# Patient Record
Sex: Female | Born: 1995 | Race: Black or African American | Hispanic: No | Marital: Single | Smoking: Current every day smoker
Health system: Southern US, Community
[De-identification: ages and names within clinical notes are randomized; demographics above are authoritative.]

---

## 2015-04-15 ENCOUNTER — Emergency Department (HOSPITAL_COMMUNITY)
Admission: EM | Admit: 2015-04-15 | Discharge: 2015-04-15 | Disposition: A | Discharge status: Home / Self Care | Payer: BLUE CROSS/BLUE SHIELD | Attending: Emergency Medicine | Admitting: Emergency Medicine

## 2015-04-15 ENCOUNTER — Emergency Department (HOSPITAL_COMMUNITY): Payer: BLUE CROSS/BLUE SHIELD

## 2015-04-15 ENCOUNTER — Encounter (HOSPITAL_COMMUNITY): Payer: Self-pay | Admitting: *Deleted

## 2015-04-15 DIAGNOSIS — R1110 Vomiting, unspecified: Secondary | ICD-10-CM | POA: Insufficient documentation

## 2015-04-15 DIAGNOSIS — J069 Acute upper respiratory infection, unspecified: Secondary | ICD-10-CM | POA: Diagnosis not present

## 2015-04-15 DIAGNOSIS — Z720 Tobacco use: Secondary | ICD-10-CM | POA: Diagnosis not present

## 2015-04-15 DIAGNOSIS — J988 Other specified respiratory disorders: Secondary | ICD-10-CM

## 2015-04-15 DIAGNOSIS — R079 Chest pain, unspecified: Secondary | ICD-10-CM | POA: Insufficient documentation

## 2015-04-15 DIAGNOSIS — B9789 Other viral agents as the cause of diseases classified elsewhere: Secondary | ICD-10-CM

## 2015-04-15 DIAGNOSIS — R05 Cough: Secondary | ICD-10-CM | POA: Diagnosis present

## 2015-04-15 LAB — CBC
HCT: 34.8 % — ABNORMAL LOW (ref 36.0–46.0)
Hemoglobin: 11.1 g/dL — ABNORMAL LOW (ref 12.0–15.0)
MCH: 25.8 pg — ABNORMAL LOW (ref 26.0–34.0)
MCHC: 31.9 g/dL (ref 30.0–36.0)
MCV: 80.9 fL (ref 78.0–100.0)
PLATELETS: 343 10*3/uL (ref 150–400)
RBC: 4.3 MIL/uL (ref 3.87–5.11)
RDW: 12.8 % (ref 11.5–15.5)
WBC: 10.7 10*3/uL — AB (ref 4.0–10.5)

## 2015-04-15 LAB — COMPREHENSIVE METABOLIC PANEL
ALBUMIN: 3.2 g/dL — AB (ref 3.5–5.2)
ALK PHOS: 62 U/L (ref 39–117)
ALT: 28 U/L (ref 0–35)
AST: 26 U/L (ref 0–37)
Anion gap: 11 (ref 5–15)
BILIRUBIN TOTAL: 0.2 mg/dL — AB (ref 0.3–1.2)
BUN: 9 mg/dL (ref 6–23)
CHLORIDE: 104 mmol/L (ref 96–112)
CO2: 21 mmol/L (ref 19–32)
Calcium: 8.7 mg/dL (ref 8.4–10.5)
Creatinine, Ser: 0.76 mg/dL (ref 0.50–1.10)
GFR calc Af Amer: >90 mL/min (ref >90)
GFR calc non Af Amer: >90 mL/min (ref >90)
Glucose, Bld: 101 mg/dL — ABNORMAL HIGH (ref 70–99)
POTASSIUM: 3.8 mmol/L (ref 3.5–5.1)
Sodium: 136 mmol/L (ref 135–145)
Total Protein: 7 g/dL (ref 6.0–8.3)

## 2015-04-15 LAB — I-STAT CG4 LACTIC ACID, ED: Lactic Acid, Venous: 0.55 mmol/L (ref 0.5–2.0)

## 2015-04-15 MED ORDER — HYDROCODONE-HOMATROPINE 5-1.5 MG/5ML PO SYRP: 5.0000 mL | ORAL_SOLUTION | Freq: Four times a day (QID) | ORAL | Status: DC | PRN

## 2015-04-15 MED ORDER — ALBUTEROL SULFATE HFA 108 (90 BASE) MCG/ACT IN AERS
2.0000 | INHALATION_SPRAY | Freq: Once | RESPIRATORY_TRACT | Status: AC
  Administered 2015-04-15: 2 via RESPIRATORY_TRACT
  Filled 2015-04-15: qty 6.7

## 2015-04-15 MED ORDER — HYDROCODONE-HOMATROPINE 5-1.5 MG/5ML PO SYRP
5.0000 mL | ORAL_SOLUTION | Freq: Once | ORAL | Status: AC
  Administered 2015-04-15: 5 mL via ORAL
  Filled 2015-04-15: qty 5

## 2015-04-15 NOTE — Discharge Instructions (Signed)
Please follow up with your primary care physician in 1-2 days. If you do not have one please call the [facility] number listed above. Please take pain medication and/or muscle relaxants as prescribed and as needed for pain. Please do not drive on narcotic pain medication or on muscle relaxants. Please use your inhaler 2 puffs every four hours for the next few days. Please read all discharge instructions and return precautions.   Upper Respiratory Infection, Adult An upper respiratory infection (URI) is also sometimes known as the common cold. The upper respiratory tract includes the nose, sinuses, throat, trachea, and bronchi. Bronchi are the airways leading to the lungs. Most people improve within 1 week, but symptoms can last up to 2 weeks. A residual cough may last even longer.  CAUSES Many different viruses can infect the tissues lining the upper respiratory tract. The tissues become irritated and inflamed and often become very moist. Mucus production is also common. A cold is contagious. You can easily spread the virus to others by oral contact. This includes kissing, sharing a glass, coughing, or sneezing. Touching your mouth or nose and then touching a surface, which is then touched by another person, can also spread the virus. SYMPTOMS  Symptoms typically develop 1 to 3 days after you come in contact with a cold virus. Symptoms vary from person to person. They may include:  Runny nose.  Sneezing.  Nasal congestion.  Sinus irritation.  Sore throat.  Loss of voice (laryngitis).  Cough.  Fatigue.  Muscle aches.  Loss of appetite.  Headache.  Low-grade fever. DIAGNOSIS  You might diagnose your own cold based on familiar symptoms, since most people get a cold 2 to 3 times a year. Your caregiver can confirm this based on your exam. Most importantly, your caregiver can check that your symptoms are not due to another disease such as strep throat, sinusitis,  pneumonia, asthma, or epiglottitis. Blood tests, throat tests, and X-rays are not necessary to diagnose a common cold, but they may sometimes be helpful in excluding other more serious diseases. Your caregiver will decide if any further tests are required. RISKS AND COMPLICATIONS  You may be at risk for a more severe case of the common cold if you smoke cigarettes, have chronic heart disease (such as heart failure) or lung disease (such as asthma), or if you have a weakened immune system. The very young and very old are also at risk for more serious infections. Bacterial sinusitis, middle ear infections, and bacterial pneumonia can complicate the common cold. The common cold can worsen asthma and chronic obstructive pulmonary disease (COPD). Sometimes, these complications can require emergency medical care and may be life-threatening. PREVENTION  The best way to protect against getting a cold is to practice good hygiene. Avoid oral or hand contact with people with cold symptoms. Wash your hands often if contact occurs. There is no clear evidence that vitamin C, vitamin E, echinacea, or exercise reduces the chance of developing a cold. However, it is always recommended to get plenty of rest and practice good nutrition. TREATMENT  Treatment is directed at relieving symptoms. There is no cure. Antibiotics are not effective, because the infection is caused by a virus, not by bacteria. Treatment may include:  Increased fluid intake. Sports drinks offer valuable electrolytes, sugars, and fluids.  Breathing heated mist or steam (vaporizer or shower).  Eating chicken soup or other clear broths, and maintaining good nutrition.  Getting plenty of rest.  Using  gargles or lozenges for comfort.  Controlling fevers with ibuprofen or acetaminophen as directed by your caregiver.  Increasing usage of your inhaler if you have asthma. Zinc gel and zinc lozenges, taken in the first 24 hours of the common cold, can  shorten the duration and lessen the severity of symptoms. Pain medicines may help with fever, muscle aches, and throat pain. A variety of non-prescription medicines are available to treat congestion and runny nose. Your caregiver can make recommendations and may suggest nasal or lung inhalers for other symptoms.  HOME CARE INSTRUCTIONS   Only take over-the-counter or prescription medicines for pain, discomfort, or fever as directed by your caregiver.  Use a warm mist humidifier or inhale steam from a shower to increase air moisture. This may keep secretions moist and make it easier to breathe.  Drink enough water and fluids to keep your urine clear or pale yellow.  Rest as needed.  Return to work when your temperature has returned to normal or as your caregiver advises. You may need to stay home longer to avoid infecting others. You can also use a face mask and careful hand washing to prevent spread of the virus. SEEK MEDICAL CARE IF:   After the first few days, you feel you are getting worse rather than better.  You need your caregiver's advice about medicines to control symptoms.  You develop chills, worsening shortness of breath, or brown or red sputum. These may be signs of pneumonia.  You develop yellow or brown nasal discharge or pain in the face, especially when you bend forward. These may be signs of sinusitis.  You develop a fever, swollen neck glands, pain with swallowing, or white areas in the back of your throat. These may be signs of strep throat. SEEK IMMEDIATE MEDICAL CARE IF:   You have a fever.  You develop severe or persistent headache, ear pain, sinus pain, or chest pain.  You develop wheezing, a prolonged cough, cough up blood, or have a change in your usual mucus (if you have chronic lung disease).  You develop sore muscles or a stiff neck. Document Released: 06/05/2001 Document Revised: 03/03/2012 Document Reviewed: 03/17/2014 [facility] Patient Information 2015  [place]. This information is not intended to replace advice given to you by your health care provider. Make sure you discuss any questions you have with your health care provider.

## 2015-04-15 NOTE — ED Provider Notes (Signed)
CSN: [id number]     Arrival date & time 04/15/15  0229 History   First MD Initiated Contact with Patient 04/15/15 [phone]     Chief Complaint  Patient presents with  . Cough     (Consider location/radiation/quality/duration/timing/severity/associated sxs/prior Treatment) HPI Comments: Patient is a 19 year old female past medical history significant for tobacco abuse presenting to the emergency department for evaluation of 2 weeks of nonproductive cough, nasal congestion, post tussive emesis, chest wall pain, subjective fever and chills. She states she was seen in urgent care was prescribed a cough suppressant that has not been providing any support. She states she's been taking her inhaler twice a day with little to no improvement. No modifying factors identified. She states she has recurrent history of bronchitis. Patient notes possible sick contacts in dorm.   History reviewed. No pertinent past medical history. History reviewed. No pertinent past surgical history. History reviewed. No pertinent family history. History  Substance Use Topics  . Smoking status: Current Every Day Smoker  . Smokeless tobacco: Not on file  . Alcohol Use: Not on file   OB History    No data available     Review of Systems  Constitutional: Positive for fever (subjective).  HENT: Positive for congestion.   Respiratory: Positive for cough and chest tightness.   Gastrointestinal: Positive for vomiting (posttussive).  All other systems reviewed and are negative.     Allergies  Review of patient's allergies indicates no known allergies.  Home Medications   Prior to Admission medications   Medication Sig Start Date End Date Taking? Authorizing Provider  HYDROcodone-homatropine (HYCODAN) 5-1.5 MG/5ML syrup Take 5 mLs by mouth every 6 (six) hours as needed. 04/15/15   Bryler Dibble, PA-C   BP 101/62 mmHg  Pulse 97  Temp(Src) 98.6 F (37 C) (Oral)  Resp 18  Wt 190 lb (86.183 kg)  SpO2 99%   LMP 03/30/2015 Physical Exam  Constitutional: She is oriented to person, place, and time. She appears well-developed and well-nourished. No distress.  HENT:  Head: Normocephalic and atraumatic.  Right Ear: External ear normal.  Left Ear: External ear normal.  Nose: Nose normal.  Mouth/Throat: Oropharynx is clear and moist. No oropharyngeal exudate.  Eyes: Conjunctivae are normal.  Neck: Normal range of motion. Neck supple.  No nuchal rigidity.   Cardiovascular: Normal rate, regular rhythm and normal heart sounds.   Pulmonary/Chest: Effort normal and breath sounds normal. No respiratory distress. She exhibits tenderness.  Nonproductive cough on examination.  Abdominal: Soft.  Musculoskeletal: Normal range of motion. She exhibits no edema.  Lymphadenopathy:    She has no cervical adenopathy.  Neurological: She is alert and oriented to person, place, and time.  Skin: Skin is warm and dry. She is not diaphoretic.  Psychiatric: She has a normal mood and affect.  Nursing note and vitals reviewed.   ED Course  Procedures (including critical care time) Medications  HYDROcodone-homatropine (HYCODAN) 5-1.5 MG/5ML syrup 5 mL (5 mLs Oral Given 04/15/15 0504)  albuterol (PROVENTIL HFA;VENTOLIN HFA) 108 (90 BASE) MCG/ACT inhaler 2 puff (2 puffs Inhalation Given 04/15/15 0503)    Labs Review Labs Reviewed  COMPREHENSIVE METABOLIC PANEL - Abnormal; Notable for the following:    Glucose, Bld 101 (*)    Albumin 3.2 (*)    Total Bilirubin 0.2 (*)    All other components within normal limits  CBC - Abnormal; Notable for the following:    WBC 10.7 (*)    Hemoglobin 11.1 (*)  HCT 34.8 (*)    MCH 25.8 (*)    All other components within normal limits  I-STAT CG4 LACTIC ACID, ED    Imaging Review Dg Chest 2 View  04/15/2015   CLINICAL DATA:  Cough for 3 weeks.  Chest pain beginning today  EXAM: CHEST  2 VIEW  COMPARISON:  None.  FINDINGS: The heart size and mediastinal contours are within  normal limits. Both lungs are clear. The visualized skeletal structures are unremarkable.  IMPRESSION: No active cardiopulmonary disease.   Electronically Signed   By: Charlett NoseKevin  Dover M.D.   On: 04/15/2015 03:03     EKG Interpretation None      MDM   Final diagnoses:  Viral respiratory illness    Filed Vitals:   04/15/15 0430  BP: 101/62  Pulse: 97  Temp:   Resp: 18   Afebrile, NAD, non-toxic appearing, AAOx4.  Patients symptoms are consistent with URI, likely viral etiology. No hypoxia or fever to suggest pneumonia or PE. Lungs clear to auscultation bilaterally. No nuchal rigidity or toxicities to suggest meningitis. Discussed that antibiotics are not indicated for viral infections. Pt will be discharged with symptomatic treatment.  Patient verbalizes understanding and is agreeable with plan. Pt is hemodynamically stable at time of discharge.      Francee PiccoloJennifer Gracen Southwell, PA-C 04/15/15 [id number]  Mirian MoMatthew Gentry, MD 04/15/15 [phone]

## 2015-04-15 NOTE — ED Notes (Signed)
Pt in c/o cough and congestion for the last few weeks, intermittent vomiting, fever at times but denies over the last few days, no distress noted, coughing noted in triage

## 2016-01-16 ENCOUNTER — Emergency Department (HOSPITAL_COMMUNITY): Payer: BLUE CROSS/BLUE SHIELD

## 2016-01-16 ENCOUNTER — Emergency Department (HOSPITAL_COMMUNITY)
Admission: EM | Admit: 2016-01-16 | Discharge: 2016-01-16 | Disposition: A | Discharge status: Home / Self Care | Payer: BLUE CROSS/BLUE SHIELD | Attending: Emergency Medicine | Admitting: Emergency Medicine

## 2016-01-16 ENCOUNTER — Encounter (HOSPITAL_COMMUNITY): Payer: Self-pay | Admitting: *Deleted

## 2016-01-16 DIAGNOSIS — R0789 Other chest pain: Secondary | ICD-10-CM | POA: Diagnosis not present

## 2016-01-16 DIAGNOSIS — F17200 Nicotine dependence, unspecified, uncomplicated: Secondary | ICD-10-CM | POA: Insufficient documentation

## 2016-01-16 DIAGNOSIS — R0602 Shortness of breath: Secondary | ICD-10-CM | POA: Insufficient documentation

## 2016-01-16 DIAGNOSIS — Z792 Long term (current) use of antibiotics: Secondary | ICD-10-CM | POA: Diagnosis not present

## 2016-01-16 DIAGNOSIS — R079 Chest pain, unspecified: Secondary | ICD-10-CM | POA: Diagnosis present

## 2016-01-16 DIAGNOSIS — Z3202 Encounter for pregnancy test, result negative: Secondary | ICD-10-CM | POA: Insufficient documentation

## 2016-01-16 DIAGNOSIS — Z79899 Other long term (current) drug therapy: Secondary | ICD-10-CM | POA: Insufficient documentation

## 2016-01-16 LAB — CBC WITH DIFFERENTIAL/PLATELET
Basophils Absolute: 0 10*3/uL (ref 0.0–0.1)
Basophils Relative: 0 %
Eosinophils Absolute: 0.1 10*3/uL (ref 0.0–0.7)
Eosinophils Relative: 1 %
HEMATOCRIT: 36.3 % (ref 36.0–46.0)
HEMOGLOBIN: 11.9 g/dL — AB (ref 12.0–15.0)
LYMPHS PCT: 50 %
Lymphs Abs: 3.6 10*3/uL (ref 0.7–4.0)
MCH: 26.3 pg (ref 26.0–34.0)
MCHC: 32.8 g/dL (ref 30.0–36.0)
MCV: 80.3 fL (ref 78.0–100.0)
MONOS PCT: 9 %
Monocytes Absolute: 0.7 10*3/uL (ref 0.1–1.0)
NEUTROS ABS: 2.9 10*3/uL (ref 1.7–7.7)
NEUTROS PCT: 40 %
Platelets: 285 10*3/uL (ref 150–400)
RBC: 4.52 MIL/uL (ref 3.87–5.11)
RDW: 13.1 % (ref 11.5–15.5)
WBC: 7.3 10*3/uL (ref 4.0–10.5)

## 2016-01-16 LAB — I-STAT BETA HCG BLOOD, ED (MC, WL, AP ONLY): I-stat hCG, quantitative: <5 m[IU]/mL (ref <5)

## 2016-01-16 LAB — BASIC METABOLIC PANEL
ANION GAP: 9 (ref 5–15)
BUN: 10 mg/dL (ref 6–20)
CO2: 24 mmol/L (ref 22–32)
CREATININE: 0.91 mg/dL (ref 0.44–1.00)
Calcium: 9.1 mg/dL (ref 8.9–10.3)
Chloride: 104 mmol/L (ref 101–111)
GFR calc Af Amer: >60 mL/min (ref >60)
GFR calc non Af Amer: >60 mL/min (ref >60)
Glucose, Bld: 95 mg/dL (ref 65–99)
Potassium: 4.2 mmol/L (ref 3.5–5.1)
Sodium: 137 mmol/L (ref 135–145)

## 2016-01-16 LAB — D-DIMER, QUANTITATIVE: D-Dimer, Quant: 0.74 ug/mL-FEU — ABNORMAL HIGH (ref 0.00–0.50)

## 2016-01-16 MED ORDER — IOHEXOL 350 MG/ML SOLN
100.0000 mL | Freq: Once | INTRAVENOUS | Status: AC | PRN
  Administered 2016-01-16: 100 mL via INTRAVENOUS

## 2016-01-16 MED ORDER — KETOROLAC TROMETHAMINE 30 MG/ML IJ SOLN
30.0000 mg | Freq: Once | INTRAMUSCULAR | Status: AC
  Administered 2016-01-16: 30 mg via INTRAVENOUS
  Filled 2016-01-16: qty 1

## 2016-01-16 MED ORDER — OXYCODONE-ACETAMINOPHEN 5-325 MG PO TABS
1.0000 | ORAL_TABLET | Freq: Once | ORAL | Status: AC
  Administered 2016-01-16: 1 via ORAL
  Filled 2016-01-16: qty 1

## 2016-01-16 MED ORDER — MORPHINE SULFATE (PF) 4 MG/ML IV SOLN
4.0000 mg | Freq: Once | INTRAVENOUS | Status: AC
  Administered 2016-01-16: 4 mg via INTRAVENOUS
  Filled 2016-01-16: qty 1

## 2016-01-16 MED ORDER — HYDROCODONE-ACETAMINOPHEN 5-325 MG PO TABS: 1.0000 | ORAL_TABLET | ORAL | Status: AC | PRN

## 2016-01-16 NOTE — ED Provider Notes (Signed)
CSN: [id number]     Arrival date & time 01/16/16  0056 History   By signing my name below, I, Arlan Organ, attest that this documentation has been prepared under the direction and in the presence of Dione Booze, MD.  Electronically Signed: Arlan Organ, ED Scribe. 01/16/2016. 3:14 AM.   Chief Complaint  Patient presents with  . Chest Pain   The history is provided by the patient. No language interpreter was used.    HPI Comments: Stacey Pearson is a 20 y.o. female without any pertinent past medical history who presents to the Emergency Department complaining of constant, ongoing midsternal chest pain that radiates to sides bilaterally and back x 1 day; worsened in last few hours. Pain is exacerbated when sitting. No alleviating factors at this time. Ongoing, intermittent shortness of breath also reported. OTC Ibuprofen attempted prior to arrival without any improvement. No prior personal history of HTN or DM. She denies any recent long distance travel. No recent surgeries or procedures. Pt is not currently on any birth control regimens. Ms. Kem Boroughs was evaluated 1 year ago for same but was safely discharged home after treatment in ED.  PCP: Dr. Delmar Landau, [place]    History reviewed. No pertinent past medical history. History reviewed. No pertinent past surgical history. No family history on file. Social History  Substance Use Topics  . Smoking status: Current Every Day Smoker  . Smokeless tobacco: None  . Alcohol Use: Yes   OB History    No data available     Review of Systems  Constitutional: Negative for fever and chills.  Respiratory: Positive for shortness of breath.   Cardiovascular: Positive for chest pain.  Gastrointestinal: Negative for nausea, vomiting and abdominal pain.  Neurological: Negative for headaches.  Psychiatric/Behavioral: Negative for confusion.  All other systems reviewed and are negative.     Allergies  Review of patient's allergies indicates no known  allergies.  Home Medications   Prior to Admission medications   Medication Sig Start Date End Date Taking? Authorizing Provider  albuterol (PROVENTIL HFA;VENTOLIN HFA) 108 (90 Base) MCG/ACT inhaler Inhale 1 puff into the lungs every 6 (six) hours as needed for wheezing or shortness of breath.   Yes Historical Provider, MD  ibuprofen (ADVIL,MOTRIN) 600 MG tablet Take 600-1,200 mg by mouth every 6 (six) hours as needed for moderate pain.   Yes Historical Provider, MD  minocycline (MINOCIN,DYNACIN) 100 MG capsule Take 100 mg by mouth 2 (two) times daily.   Yes Historical Provider, MD   Triage Vitals: BP 113/82 mmHg  Pulse 93  Temp(Src) 98.4 F (36.9 C) (Oral)  Resp 15  Ht  (1.676 m)  SpO2 99%  LMP 01/16/2016   Physical Exam  Constitutional: She is oriented to person, place, and time. She appears well-developed and well-nourished. No distress.  HENT:  Head: Normocephalic and atraumatic.  Eyes: EOM are normal. Pupils are equal, round, and reactive to light.  Neck: Normal range of motion. Neck supple. No JVD present.  Cardiovascular: Normal rate, regular rhythm and normal heart sounds.   No murmur heard. Pulmonary/Chest: Effort normal and breath sounds normal. She has no wheezes. She has no rales. She exhibits tenderness.  Moderate tenderness to bilateral parasternal   Abdominal: Soft. Bowel sounds are normal. She exhibits no distension and no mass. There is no tenderness.  Musculoskeletal: Normal range of motion. She exhibits no edema.  Lymphadenopathy:    She has no cervical adenopathy.  Neurological: She is alert and oriented  to person, place, and time. No cranial nerve deficit. She exhibits normal muscle tone. Coordination normal.  Skin: Skin is warm and dry. No rash noted.  Psychiatric: She has a normal mood and affect. Her behavior is normal. Judgment and thought content normal.  Nursing note and vitals reviewed.   ED Course  Procedures (including critical care  time)  DIAGNOSTIC STUDIES: Oxygen Saturation is 99% on RA, Normal by my interpretation.    COORDINATION OF CARE: 3:11 AM- Will give Percocet. Will order CXR and EKG. Discussed treatment plan with pt at bedside and pt agreed to plan.     Labs Review Results for orders placed or performed during the hospital encounter of 01/16/16  Basic metabolic panel  Result Value Ref Range   Sodium 137 135 - 145 mmol/L   Potassium 4.2 3.5 - 5.1 mmol/L   Chloride 104 101 - 111 mmol/L   CO2 24 22 - 32 mmol/L   Glucose, Bld 95 65 - 99 mg/dL   BUN 10 6 - 20 mg/dL   Creatinine, Ser 1.61 0.44 - 1.00 mg/dL   Calcium 9.1 8.9 - 09.6 mg/dL   GFR calc non Af Amer >60 >60 mL/min   GFR calc Af Amer >60 >60 mL/min   Anion gap 9 5 - 15  CBC with Differential  Result Value Ref Range   WBC 7.3 4.0 - 10.5 K/uL   RBC 4.52 3.87 - 5.11 MIL/uL   Hemoglobin 11.9 (L) 12.0 - 15.0 g/dL   HCT 04.5 40.9 - 81.1 %   MCV 80.3 78.0 - 100.0 fL   MCH 26.3 26.0 - 34.0 pg   MCHC 32.8 30.0 - 36.0 g/dL   RDW 91.4 78.2 - 95.6 %   Platelets 285 150 - 400 K/uL   Neutrophils Relative % 40 %   Neutro Abs 2.9 1.7 - 7.7 K/uL   Lymphocytes Relative 50 %   Lymphs Abs 3.6 0.7 - 4.0 K/uL   Monocytes Relative 9 %   Monocytes Absolute 0.7 0.1 - 1.0 K/uL   Eosinophils Relative 1 %   Eosinophils Absolute 0.1 0.0 - 0.7 K/uL   Basophils Relative 0 %   Basophils Absolute 0.0 0.0 - 0.1 K/uL  D-dimer, quantitative  Result Value Ref Range   D-Dimer, Quant 0.74 (H) 0.00 - 0.50 ug/mL-FEU  I-Stat beta hCG blood, ED  Result Value Ref Range   I-stat hCG, quantitative <5.0 <5 mIU/mL   Comment 3            Imaging Review  Dg Chest 2 View  01/16/2016  CLINICAL DATA:  Chronic intermittent substernal chest pain and shortness of breath. Initial encounter. EXAM: CHEST  2 VIEW COMPARISON:  Chest radiograph performed 04/15/2015 FINDINGS: The lungs are well-aerated and clear. There is no evidence of focal opacification, pleural effusion or  pneumothorax. The heart is normal in size; the mediastinal contour is within normal limits. No acute osseous abnormalities are seen. IMPRESSION: No acute cardiopulmonary process seen. Electronically Signed   By: Roanna Raider M.D.   On: 01/16/2016 01:57   Ct Angio Chest Pe W/cm &/or Wo Cm  01/16/2016  CLINICAL DATA:  Acute onset of constant midsternal chest pain, radiating to the sides and back. Intermittent shortness of breath. Initial encounter. EXAM: CT ANGIOGRAPHY CHEST WITH CONTRAST TECHNIQUE: Multidetector CT imaging of the chest was performed using the standard protocol during bolus administration of intravenous contrast. Multiplanar CT image reconstructions and MIPs were obtained to evaluate the vascular anatomy. CONTRAST:  OMNIPAQUE IOHEXOL 350 MG/ML SOLN COMPARISON:  Chest radiograph performed earlier today at 1:38 a.m. FINDINGS: There is no evidence of pulmonary embolus. Minimal bibasilar atelectasis is noted. A 4 mm nodule at the left lower lobe (image 50 of 88) is most likely benign, given the patient's age. There is no evidence of significant focal consolidation, pleural effusion or pneumothorax. No masses are identified; no abnormal focal contrast enhancement is seen. The mediastinum is unremarkable in appearance. No mediastinal lymphadenopathy is seen. No pericardial effusion is identified. The great vessels are grossly unremarkable. No axillary lymphadenopathy is seen. The visualized portions of the thyroid gland are unremarkable in appearance. The visualized portions of the liver and spleen are unremarkable. The visualized portions of the pancreas, gallbladder, stomach, adrenal glands and kidneys are within normal limits. No acute osseous abnormalities are seen. Review of the MIP images confirms the above findings. IMPRESSION: 1. No evidence of pulmonary embolus. 2. Minimal bibasilar atelectasis noted. Lungs otherwise grossly clear. Electronically Signed   By: Roanna Raider M.D.   On:  01/16/2016 05:48     I have personally reviewed and evaluated these images and lab results as part of my medical decision-making.   EKG Interpretation   Date/Time:  Monday January 16 2016 01:05:45 EST Ventricular Rate:  97 PR Interval:  162 QRS Duration: 86 QT Interval:  350 QTC Calculation: 444 R Axis:   62 Text Interpretation:  Normal sinus rhythm Normal ECG No old tracing to  compare Confirmed by [facility]  MD, Thelma Viana ([id number]) on 01/16/2016 3:01:04 AM      MDM   Final diagnoses:  Atypical chest pain    Chest pain of uncertain cause. Screening labs are obtained. She is given a dose of ketorolac with no improvement. Laboratory workup was significant for elevated d-dimer and she is sent for CT angiogram which was normal. She was given morphine for pain with good relief. She is discharged with prescription for hydrocodone acetaminophen.  I personally performed the services described in this documentation, which was scribed in my presence. The recorded information has been reviewed and is accurate.      Dione Booze, MD 01/16/16 [phone]

## 2016-01-16 NOTE — ED Notes (Signed)
The pt has had chest pain since yesterday  Worse sometimes when she breathes.  She thinks she has bronchitis again she had the same last year this time,  lmp now

## 2016-01-16 NOTE — ED Notes (Signed)
Spoke with CT, patient soon to be in CT for testing

## 2016-01-16 NOTE — Discharge Instructions (Signed)
Nonspecific Chest Pain  °Chest pain can be caused by many different conditions. There is always a chance that your pain could be related to something serious, such as a heart attack or a blood clot in your lungs. Chest pain can also be caused by conditions that are not life-threatening. If you have chest pain, it is very important to follow up with your health care provider. °CAUSES  °Chest pain can be caused by: °· Heartburn. °· Pneumonia or bronchitis. °· Anxiety or stress. °· Inflammation around your heart (pericarditis) or lung (pleuritis or pleurisy). °· A blood clot in your lung. °· A collapsed lung (pneumothorax). It can develop suddenly on its own (spontaneous pneumothorax) or from trauma to the chest. °· Shingles infection (varicella-zoster virus). °· Heart attack. °· Damage to the bones, muscles, and cartilage that make up your chest wall. This can include: °¨ Bruised bones due to injury. °¨ Strained muscles or cartilage due to frequent or repeated coughing or overwork. °¨ Fracture to one or more ribs. °¨ Sore cartilage due to inflammation (costochondritis). °RISK FACTORS  °Risk factors for chest pain may include: °· Activities that increase your risk for trauma or injury to your chest. °· Respiratory infections or conditions that cause frequent coughing. °· Medical conditions or overeating that can cause heartburn. °· Heart disease or family history of heart disease. °· Conditions or health behaviors that increase your risk of developing a blood clot. °· Having had chicken pox (varicella zoster). °SIGNS AND SYMPTOMS °Chest pain can feel like: °· Burning or tingling on the surface of your chest or deep in your chest. °· Crushing, pressure, aching, or squeezing pain. °· Dull or sharp pain that is worse when you move, cough, or take a deep breath. °· Pain that is also felt in your back, neck, shoulder, or arm, or pain that spreads to any of these areas. °Your chest pain may come and go, or it may stay  constant. °DIAGNOSIS °Lab tests or other studies may be needed to find the cause of your pain. Your health care provider may have you take a test called an ambulatory ECG (electrocardiogram). An ECG records your heartbeat patterns at the time the test is performed. You may also have other tests, such as: °· Transthoracic echocardiogram (TTE). During echocardiography, sound waves are used to create a picture of all of the heart structures and to look at how blood flows through your heart. °· Transesophageal echocardiogram (TEE). This is a more advanced imaging test that obtains images from inside your body. It allows your health care provider to see your heart in finer detail. °· Cardiac monitoring. This allows your health care provider to monitor your heart rate and rhythm in real time. °· Holter monitor. This is a portable device that records your heartbeat and can help to diagnose abnormal heartbeats. It allows your health care provider to track your heart activity for several days, if needed. °· Stress tests. These can be done through exercise or by taking medicine that makes your heart beat more quickly. °· Blood tests. °· Imaging tests. °TREATMENT  °Your treatment depends on what is causing your chest pain. Treatment may include: °· Medicines. These may include: °¨ Acid blockers for heartburn. °¨ Anti-inflammatory medicine. °¨ Pain medicine for inflammatory conditions. °¨ Antibiotic medicine, if an infection is present. °¨ Medicines to dissolve blood clots. °¨ Medicines to treat coronary artery disease. °· Supportive care for conditions that do not require medicines. This may include: °¨ Resting. °¨ Applying heat   or cold packs to injured areas.  Limiting activities until pain decreases. HOME CARE INSTRUCTIONS  If you were prescribed an antibiotic medicine, finish it all even if you start to feel better.  Avoid any activities that bring on chest pain.  Do not use any tobacco products, including  cigarettes, chewing tobacco, or electronic cigarettes. If you need help quitting, ask your health care provider.  Do not drink alcohol.  Take medicines only as directed by your health care provider.  Keep all follow-up visits as directed by your health care provider. This is important. This includes any further testing if your chest pain does not go away.  If heartburn is the cause for your chest pain, you may be told to keep your head raised (elevated) while sleeping. This reduces the chance that acid will go from your stomach into your esophagus.  Make lifestyle changes as directed by your health care provider. These may include:  Getting regular exercise. Ask your health care provider to suggest some activities that are safe for you.  Eating a heart-healthy diet. A registered dietitian can help you to learn healthy eating options.  Maintaining a healthy weight.  Managing diabetes, if necessary.  Reducing stress. SEEK MEDICAL CARE IF:  Your chest pain does not go away after treatment.  You have a rash with blisters on your chest.  You have a fever. SEEK IMMEDIATE MEDICAL CARE IF:   Your chest pain is worse.  You have an increasing cough, or you cough up blood.  You have severe abdominal pain.  You have severe weakness.  You faint.  You have chills.  You have sudden, unexplained chest discomfort.  You have sudden, unexplained discomfort in your arms, back, neck, or jaw.  You have shortness of breath at any time.  You suddenly start to sweat, or your skin gets clammy.  You feel nauseous or you vomit.  You suddenly feel light-headed or dizzy.  Your heart begins to beat quickly, or it feels like it is skipping beats. These symptoms may represent a serious problem that is an emergency. Do not wait to see if the symptoms will go away. Get medical help right away. Call your local emergency services (911 in the [place] Do not drive yourself to the hospital.   This  information is not intended to replace advice given to you by your health care provider. Make sure you discuss any questions you have with your health care provider.   Document Released: 09/19/2005 Document Revised: 12/31/2014 Document Reviewed: 07/16/2014 [employer] Interactive Patient Education 2016 [employer]  Acetaminophen; Hydrocodone tablets or capsules What is this medicine? ACETAMINOPHEN; HYDROCODONE (a set a MEE noe fen; hye droe KOE done) is a pain reliever. It is used to treat moderate to severe pain. This medicine may be used for other purposes; ask your health care provider or pharmacist if you have questions. What should I tell my health care provider before I take this medicine? They need to know if you have any of these conditions: -brain tumor -Crohn's disease, inflammatory bowel disease, or ulcerative colitis -drug abuse or addiction -head injury -heart or circulation problems -if you often drink alcohol -kidney disease or problems going to the bathroom -liver disease -lung disease, asthma, or breathing problems -an unusual or allergic reaction to acetaminophen, hydrocodone, other opioid analgesics, other medicines, foods, dyes, or preservatives -pregnant or trying to get pregnant -breast-feeding How should I use this medicine? Take this medicine by mouth. Swallow it with a full glass  of water. Follow the directions on the prescription label. If the medicine upsets your stomach, take the medicine with food or milk. Do not take more than you are told to take. Talk to your pediatrician regarding the use of this medicine in children. This medicine is not approved for use in children. Patients over 65 years may have a stronger reaction and need a smaller dose. Overdosage: If you think you have taken too much of this medicine contact a poison control center or emergency room at once. NOTE: This medicine is only for you. Do not share this medicine with others. What if I  miss a dose? If you miss a dose, take it as soon as you can. If it is almost time for your next dose, take only that dose. Do not take double or extra doses. What may interact with this medicine? -alcohol -antihistamines -isoniazid -medicines for depression, anxiety, or psychotic disturbances -medicines for sleep -muscle relaxants -naltrexone -narcotic medicines (opiates) for pain -phenobarbital -ritonavir -tramadol This list may not describe all possible interactions. Give your health care provider a list of all the medicines, herbs, non-prescription drugs, or dietary supplements you use. Also tell them if you smoke, drink alcohol, or use illegal drugs. Some items may interact with your medicine. What should I watch for while using this medicine? Tell your doctor or health care professional if your pain does not go away, if it gets worse, or if you have new or a different type of pain. You may develop tolerance to the medicine. Tolerance means that you will need a higher dose of the medicine for pain relief. Tolerance is normal and is expected if you take the medicine for a long time. Do not suddenly stop taking your medicine because you may develop a severe reaction. Your body becomes used to the medicine. This does NOT mean you are addicted. Addiction is a behavior related to getting and using a drug for a non-medical reason. If you have pain, you have a medical reason to take pain medicine. Your doctor will tell you how much medicine to take. If your doctor wants you to stop the medicine, the dose will be slowly lowered over time to avoid any side effects. You may get drowsy or dizzy when you first start taking the medicine or change doses. Do not drive, use machinery, or do anything that may be dangerous until you know how the medicine affects you. Stand or sit up slowly. There are different types of narcotic medicines (opiates) for pain. If you take more than one type at the same time, you  may have more side effects. Give your health care provider a list of all medicines you use. Your doctor will tell you how much medicine to take. Do not take more medicine than directed. Call emergency for help if you have problems breathing. The medicine will cause constipation. Try to have a bowel movement at least every 2 to 3 days. If you do not have a bowel movement for 3 days, call your doctor or health care professional. Too much acetaminophen can be very dangerous. Do not take Tylenol (acetaminophen) or medicines that contain acetaminophen with this medicine. Many non-prescription medicines contain acetaminophen. Always read the labels carefully. What side effects may I notice from receiving this medicine? Side effects that you should report to your doctor or health care professional as soon as possible: -allergic reactions like skin rash, itching or hives, swelling of the face, lips, or tongue -breathing problems -confusion -feeling  faint or lightheaded, falls -stomach pain -yellowing of the eyes or skin Side effects that usually do not require medical attention (report to your doctor or health care professional if they continue or are bothersome): -nausea, vomiting -stomach upset This list may not describe all possible side effects. Call your doctor for medical advice about side effects. You may report side effects to FDA at 1-800-FDA-1088. Where should I keep my medicine? Keep out of the reach of children. This medicine can be abused. Keep your medicine in a safe place to protect it from theft. Do not share this medicine with anyone. Selling or giving away this medicine is dangerous and against the law. This medicine may cause accidental overdose and death if it taken by other adults, children, or pets. Mix any unused medicine with a substance like cat litter or coffee grounds. Then throw the medicine away in a sealed container like a sealed bag or a coffee can with a lid. Do not use the  medicine after the expiration date. Store at room temperature between 15 and 30 degrees C (59 and 86 degrees F). NOTE: This sheet is a summary. It may not cover all possible information. If you have questions about this medicine, talk to your doctor, pharmacist, or health care provider.    2016, [employer]. (2014-11-10 15:29:20)

## 2016-01-16 NOTE — ED Notes (Signed)
Patient is in xray 

## 2016-01-16 NOTE — ED Notes (Signed)
Taken to CT at this time. 

## 2016-01-16 NOTE — ED Notes (Signed)
Pt verbalized understanding of d/c instructions, prescriptions, and follow-up care. No further questions/concerns, VSS, ambulatory w/ steady gait (refused wheelchair) 

## 2016-01-16 NOTE — ED Notes (Signed)
Reported d-dimer of 0.74 to Dr. Preston Fleeting, MD acknowledges, planning for CTA.

## 2016-04-02 IMAGING — CT CT ANGIO CHEST
2 of 8 series · 18 of 46 positions shown · IV contrast (APPLIED)
Comparison: Chest radiograph performed earlier today at [DATE] a.m.

CLINICAL DATA: Acute onset of constant midsternal chest pain,
radiating to the sides and back. Intermittent shortness of breath.
Initial encounter.

EXAM:
CT ANGIOGRAPHY CHEST WITH CONTRAST
TECHNIQUE: Multidetector CT imaging of the chest was performed using the
standard protocol during bolus administration of intravenous
contrast. Multiplanar CT image reconstructions and MIPs were
obtained to evaluate the vascular anatomy.
CONTRAST:  100mL OMNIPAQUE IOHEXOL 350 MG/ML SOLN

[Series 5: thins · axial · 0.69mm/px · z∈[+1584,+1820]mm · 15 of 260 slices shown]
[im 12/260  lung]
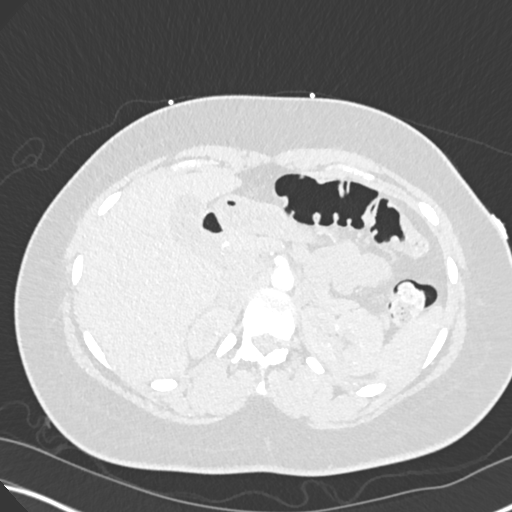
[im 36/260  soft-tissue]
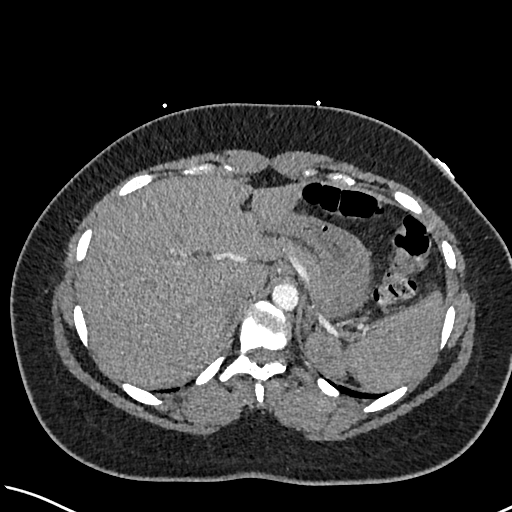
[im 48/260  lung]
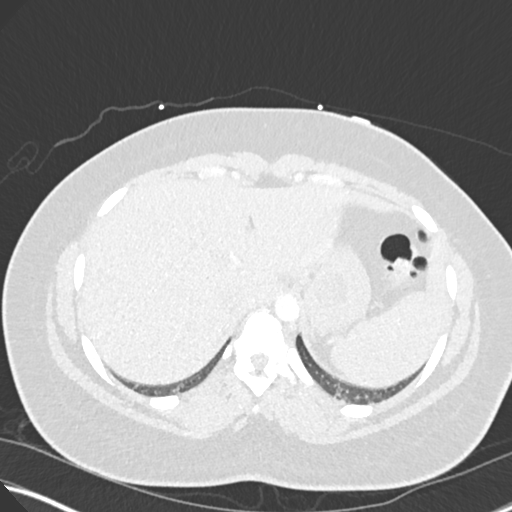
[im 59/260  soft-tissue]
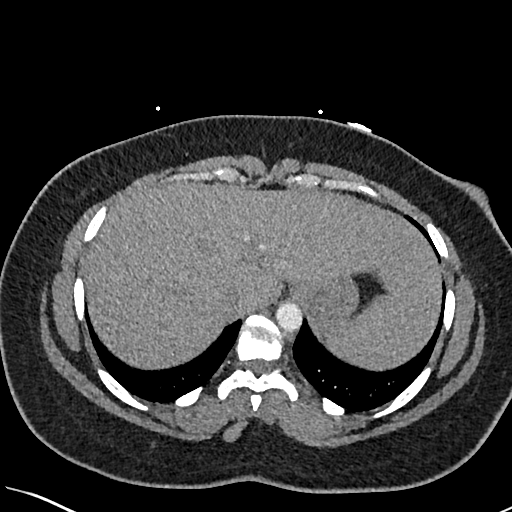
[im 83/260  lung]
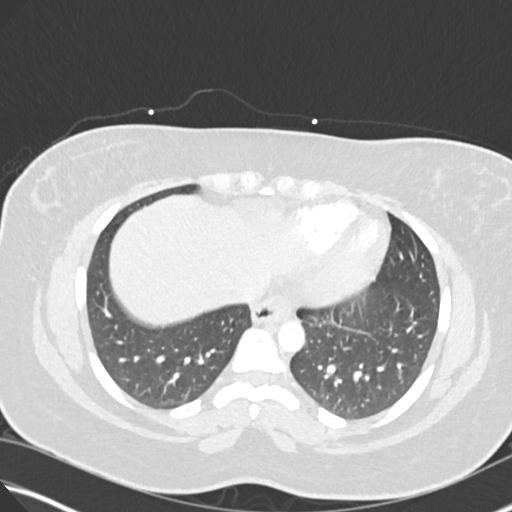
[im 95/260  soft-tissue]
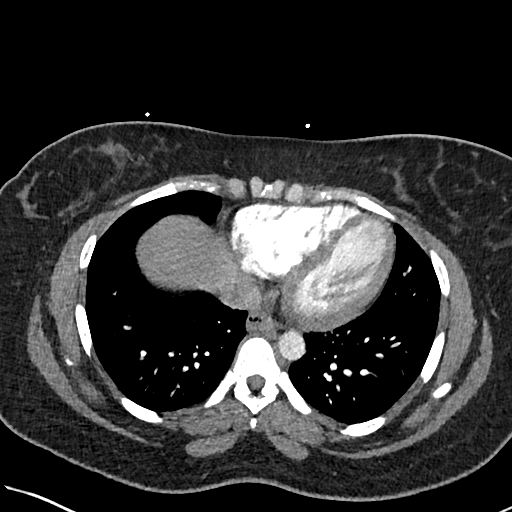
[im 118/260  lung]
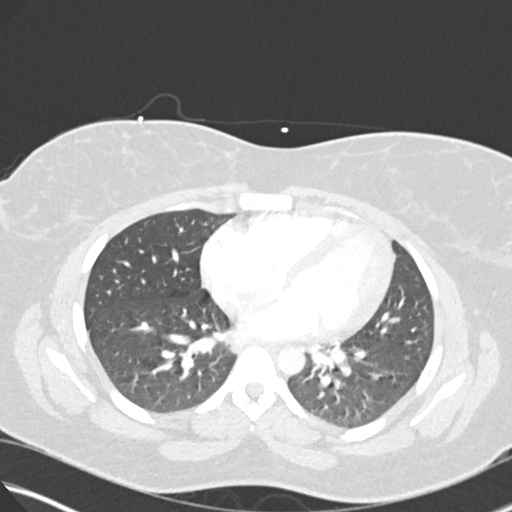
[im 130/260  soft-tissue]
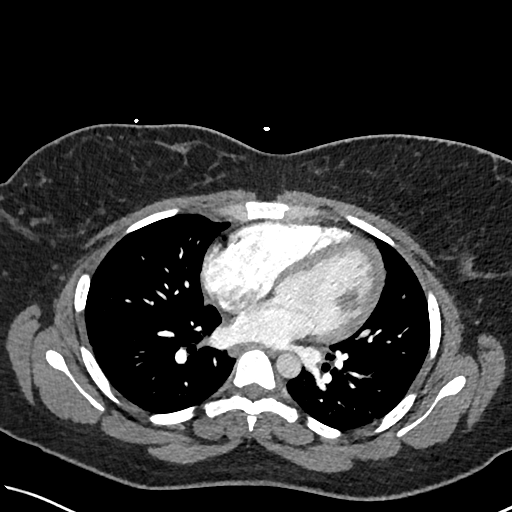
[im 142/260  lung]
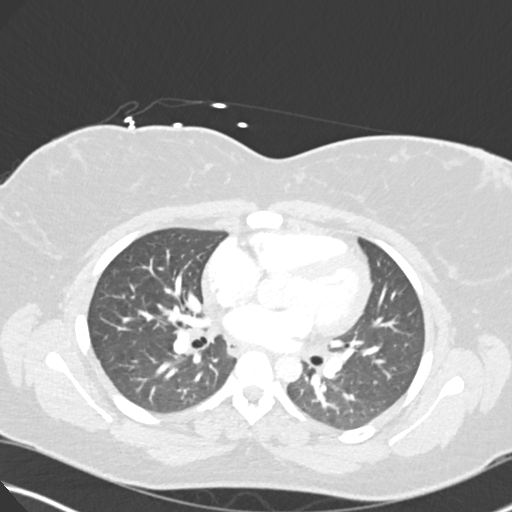
[im 165/260  soft-tissue]
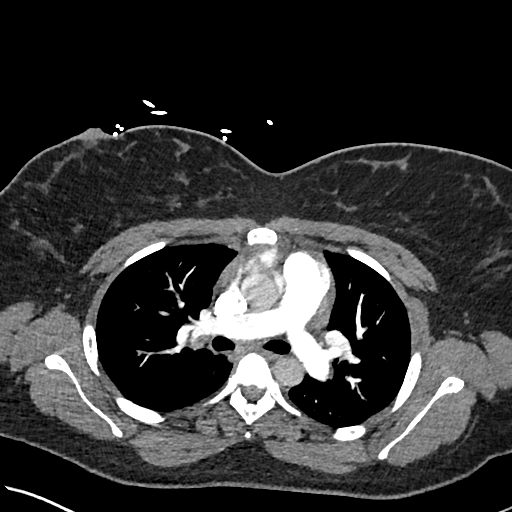
[im 177/260  lung]
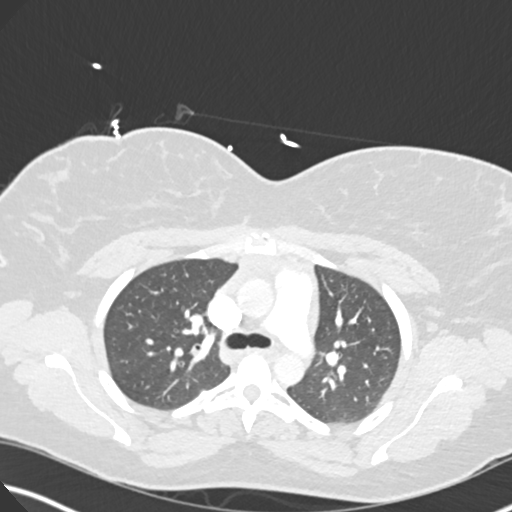
[im 201/260  soft-tissue]
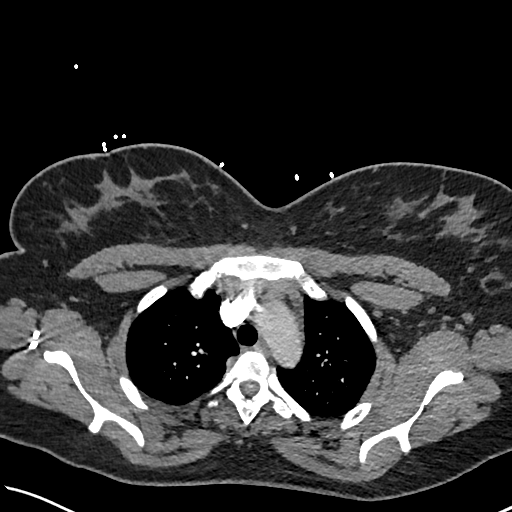
[im 212/260  lung]
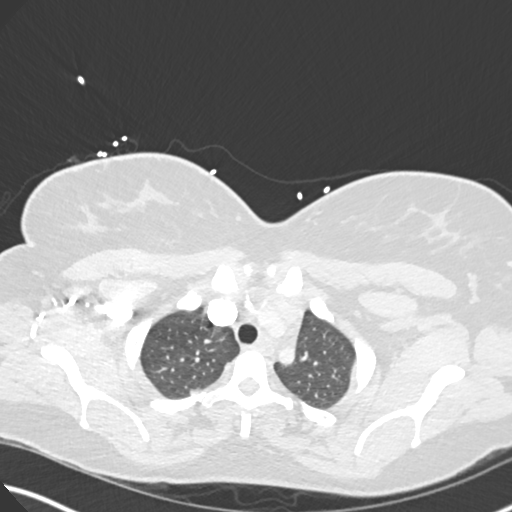
[im 224/260  soft-tissue]
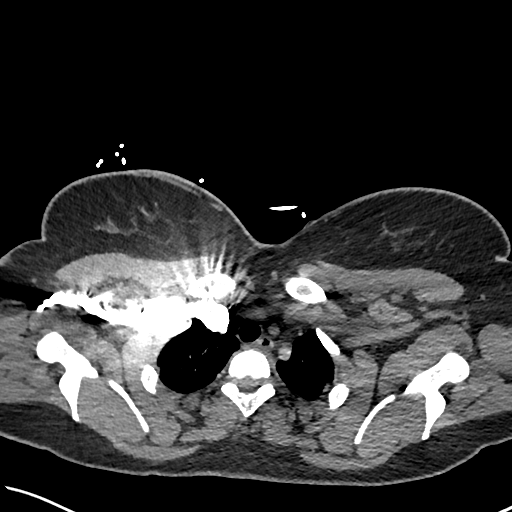
[im 248/260  lung]
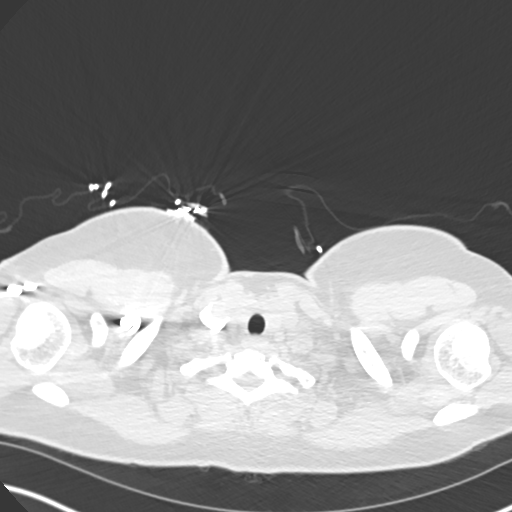

[Series 7: coronal mpr · coronal · 0.53mm/px · 3 of 118 slices shown]
[im 30/118  soft-tissue]
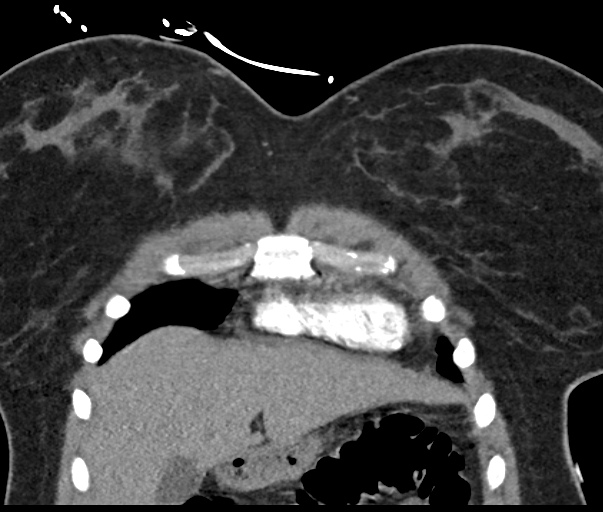
[im 59/118  soft-tissue]
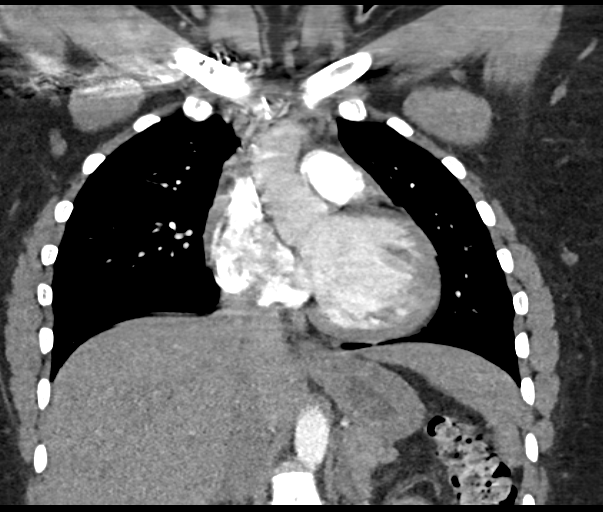
[im 88/118  soft-tissue]
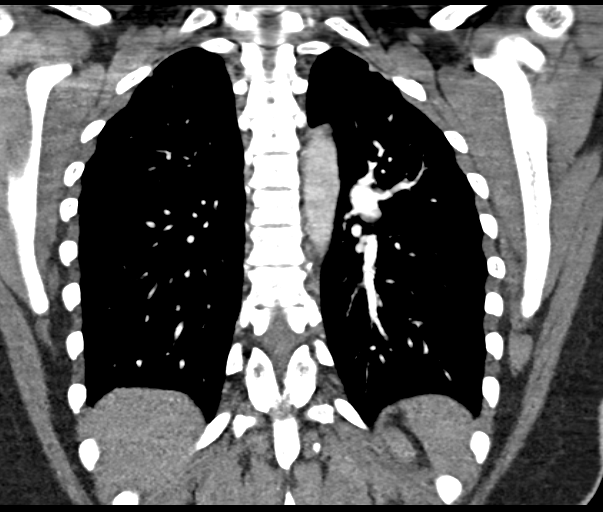

[18 of 46 positions shown; findings below may reference images not displayed]

FINDINGS: There is no evidence of pulmonary embolus.

Minimal bibasilar atelectasis is noted. A 4 mm nodule at the left
lower lobe (image 50 of 88) is most likely benign, given the
patient's age. There is no evidence of significant focal
consolidation, pleural effusion or pneumothorax. No masses are
identified; no abnormal focal contrast enhancement is seen.

The mediastinum is unremarkable in appearance. No mediastinal
lymphadenopathy is seen. No pericardial effusion is identified. The
great vessels are grossly unremarkable. No axillary lymphadenopathy
is seen. The visualized portions of the thyroid gland are
unremarkable in appearance.

The visualized portions of the liver and spleen are unremarkable.
The visualized portions of the pancreas, gallbladder, stomach,
adrenal glands and kidneys are within normal limits.

No acute osseous abnormalities are seen.

Review of the MIP images confirms the above findings.
IMPRESSION: 1. No evidence of pulmonary embolus.
2. Minimal bibasilar atelectasis noted. Lungs otherwise grossly
clear.

## 2017-01-21 ENCOUNTER — Ambulatory Visit (HOSPITAL_COMMUNITY)
Admission: EM | Admit: 2017-01-21 | Discharge: 2017-01-21 | Discharge status: Against Medical Advice | Payer: BLUE CROSS/BLUE SHIELD

## 2017-01-21 NOTE — ED Triage Notes (Signed)
No answer x1
